# Patient Record
Sex: Female | Born: 1935 | Race: White | Hispanic: No | Marital: Married | Smoking: Never smoker
Health system: Southern US, Community
[De-identification: ages and names within clinical notes are randomized; demographics above are authoritative.]

## PROBLEM LIST (undated history)

## (undated) DIAGNOSIS — I10 Essential (primary) hypertension: Secondary | ICD-10-CM

## (undated) DIAGNOSIS — E079 Disorder of thyroid, unspecified: Secondary | ICD-10-CM

## (undated) DIAGNOSIS — M109 Gout, unspecified: Secondary | ICD-10-CM

## (undated) DIAGNOSIS — M316 Other giant cell arteritis: Secondary | ICD-10-CM

## (undated) HISTORY — PX: BACK SURGERY: SHX140

## (undated) HISTORY — PX: TOTAL SHOULDER REPLACEMENT: SUR1217

## (undated) HISTORY — PX: ABDOMINAL HYSTERECTOMY: SHX81

## (undated) HISTORY — PX: CHOLECYSTECTOMY: SHX55

## (undated) HISTORY — PX: TONSILLECTOMY: SUR1361

## (undated) HISTORY — PX: JOINT REPLACEMENT: SHX530

## (undated) HISTORY — PX: APPENDECTOMY: SHX54

## (undated) HISTORY — PX: TOTAL HIP ARTHROPLASTY: SHX124

---

## 2015-07-09 HISTORY — PX: BACK SURGERY: SHX140

## 2016-09-05 DIAGNOSIS — M109 Gout, unspecified: Secondary | ICD-10-CM

## 2016-09-05 HISTORY — DX: Gout, unspecified: M10.9

## 2017-03-08 ENCOUNTER — Encounter (HOSPITAL_BASED_OUTPATIENT_CLINIC_OR_DEPARTMENT_OTHER): Payer: Self-pay

## 2017-03-08 ENCOUNTER — Emergency Department (HOSPITAL_BASED_OUTPATIENT_CLINIC_OR_DEPARTMENT_OTHER)
Admission: EM | Admit: 2017-03-08 | Discharge: 2017-03-08 | Disposition: A | Discharge status: Home / Self Care | Payer: Medicare HMO | Attending: Emergency Medicine | Admitting: Emergency Medicine

## 2017-03-08 ENCOUNTER — Emergency Department (HOSPITAL_BASED_OUTPATIENT_CLINIC_OR_DEPARTMENT_OTHER): Payer: Medicare HMO

## 2017-03-08 DIAGNOSIS — Z79899 Other long term (current) drug therapy: Secondary | ICD-10-CM | POA: Diagnosis not present

## 2017-03-08 DIAGNOSIS — M79671 Pain in right foot: Secondary | ICD-10-CM | POA: Diagnosis present

## 2017-03-08 DIAGNOSIS — L03115 Cellulitis of right lower limb: Secondary | ICD-10-CM | POA: Insufficient documentation

## 2017-03-08 DIAGNOSIS — I10 Essential (primary) hypertension: Secondary | ICD-10-CM | POA: Insufficient documentation

## 2017-03-08 DIAGNOSIS — L03818 Cellulitis of other sites: Secondary | ICD-10-CM

## 2017-03-08 DIAGNOSIS — R52 Pain, unspecified: Secondary | ICD-10-CM

## 2017-03-08 HISTORY — DX: Disorder of thyroid, unspecified: E07.9

## 2017-03-08 HISTORY — DX: Essential (primary) hypertension: I10

## 2017-03-08 LAB — CBC WITH DIFFERENTIAL/PLATELET
Basophils Absolute: 0 10*3/uL (ref 0.0–0.1)
Basophils Relative: 0 %
EOS ABS: 0.2 10*3/uL (ref 0.0–0.7)
EOS PCT: 1 %
HEMATOCRIT: 36 % (ref 36.0–46.0)
Hemoglobin: 11.9 g/dL — ABNORMAL LOW (ref 12.0–15.0)
LYMPHS ABS: 1 10*3/uL (ref 0.7–4.0)
Lymphocytes Relative: 6 %
MCH: 28.5 pg (ref 26.0–34.0)
MCHC: 33.1 g/dL (ref 30.0–36.0)
MCV: 86.3 fL (ref 78.0–100.0)
MONO ABS: 1.5 10*3/uL — AB (ref 0.1–1.0)
MONOS PCT: 9 %
NEUTROS PCT: 85 %
Neutro Abs: 14.5 10*3/uL — ABNORMAL HIGH (ref 1.7–7.7)
Platelets: 254 10*3/uL (ref 150–400)
RBC: 4.17 MIL/uL (ref 3.87–5.11)
RDW: 13.6 % (ref 11.5–15.5)
WBC: 17.1 10*3/uL — ABNORMAL HIGH (ref 4.0–10.5)

## 2017-03-08 LAB — BASIC METABOLIC PANEL
Anion gap: 9 (ref 5–15)
BUN: 17 mg/dL (ref 6–20)
CALCIUM: 9.1 mg/dL (ref 8.9–10.3)
CO2: 26 mmol/L (ref 22–32)
Chloride: 99 mmol/L — ABNORMAL LOW (ref 101–111)
Creatinine, Ser: 0.89 mg/dL (ref 0.44–1.00)
GFR, EST NON AFRICAN AMERICAN: 60 mL/min — AB (ref >60)
GLUCOSE: 120 mg/dL — AB (ref 65–99)
POTASSIUM: 3.8 mmol/L (ref 3.5–5.1)
Sodium: 134 mmol/L — ABNORMAL LOW (ref 135–145)

## 2017-03-08 MED ORDER — HYDROCODONE-ACETAMINOPHEN 5-325 MG PO TABS: 1.0000 | ORAL_TABLET | Freq: Four times a day (QID) | ORAL | 0 refills | Status: DC | PRN

## 2017-03-08 MED ORDER — CEPHALEXIN 500 MG PO CAPS: 500.0000 mg | ORAL_CAPSULE | Freq: Four times a day (QID) | ORAL | 0 refills | Status: DC

## 2017-03-08 MED ORDER — HYDROCODONE-ACETAMINOPHEN 5-325 MG PO TABS
1.0000 | ORAL_TABLET | Freq: Once | ORAL | Status: AC
  Administered 2017-03-08: 1 via ORAL
  Filled 2017-03-08: qty 1

## 2017-03-08 NOTE — ED Notes (Signed)
MD at bedside discussing results with patient and family. 

## 2017-03-08 NOTE — ED Provider Notes (Signed)
[facility] Note   CSN: [id number] Arrival date & time: 03/08/17  1013     History   Chief Complaint Chief Complaint  Patient presents with  . Foot Pain    HPI Regina Holden is a 82 y.o. female.  HPI  82 year old female who presents with right foot pain 3 days. She has a history of temporal arteritis, on chronic steroids. Also history of hypertension and thyroid disease. States about 3 days ago developed a cramping pain in her right foot. Over the past few days has developed swelling to the dorsum of her right foot. She has been unable to bear weight on that foot due to pain. No fevers, chills, night sweats, nausea or vomiting. No injury or fall. No history of rheumatoid arthritis or gout. Tramadol, without relief in her pain. Pain worse with ambulation and movement. Improved with rest.  Past Medical History:  Diagnosis Date  . Hypertension   . Thyroid disease     There are no active problems to display for this patient.   History reviewed. No pertinent surgical history.  OB History    No data available       Home Medications    Prior to Admission medications   Medication Sig Start Date End Date Taking? Authorizing Provider  bisoprolol-hydrochlorothiazide (ZIAC) 10-6.25 MG tablet Take 1 tablet by mouth daily.   Yes [provider]  diclofenac sodium (VOLTAREN) 1 % GEL Apply topically 4 (four) times daily.   Yes [provider]  levothyroxine (SYNTHROID, LEVOTHROID) 88 MCG tablet Take 88 mcg by mouth daily before breakfast.   Yes [provider]  traMADol (ULTRAM) 50 MG tablet Take by mouth every 6 (six) hours as needed.   Yes [provider]  cephALEXin (KEFLEX) 500 MG capsule Take 1 capsule (500 mg total) by mouth 4 (four) times daily. 03/08/17 03/15/17  Lavera GuiseLiu, Rajohn Henery Duo, MD  HYDROcodone-acetaminophen (NORCO/VICODIN) 5-325 MG tablet Take 1 tablet by mouth every 6 (six) hours as needed for moderate pain. 03/08/17    Lavera GuiseLiu, Arkel Cartwright Duo, MD    Family History No family history on file.  Social History Social History  Substance Use Topics  . Smoking status: Never Smoker  . Smokeless tobacco: Never Used  . Alcohol use No     Allergies   Amlodipine; Codeine; and Macrobid [nitrofurantoin macrocrystal]   Review of Systems Review of Systems  Constitutional: Negative for fever.  Respiratory: Negative for shortness of breath.   Cardiovascular: Negative for chest pain.  Gastrointestinal: Negative for nausea and vomiting.  Musculoskeletal:       Right foot pain  Skin: Negative for wound.  All other systems reviewed and are negative.    Physical Exam Updated Vital Signs BP (!) 158/52 (BP Location: Right Arm)   Pulse 62   Temp 98.3 F (36.8 C) (Oral)   Resp 20   Ht 5\' 3"  (1.6 m)   Wt 75.8 kg (167 lb)   SpO2 98%   BMI 29.58 kg/m   Physical Exam Physical Exam  Nursing note and vitals reviewed. Constitutional: Well developed, well nourished, non-toxic, and in no acute distress Head: Normocephalic and atraumatic.  Mouth/Throat: Oropharynx is clear and moist.  Neck: Normal range of motion. Neck supple.  Cardiovascular: Normal rate and regular rhythm.   +2 DP pulses bilaterally Pulmonary/Chest: Effort normal and breath sounds normal.  Abdominal: Soft. There is no tenderness. There is no rebound and no guarding.  Musculoskeletal: No deformities, but limited range of  motion of the right foot due to pain.  Neurological: Alert, no facial droop, fluent speech, sensation to light touch intact in bilateral lower extremities, full strength and large toe dorsi plantar flexion bilaterally Skin: Skin is warm and dry.  there is warmth, mild erythema, and tenderness to palpation over the dorsum of the right foot. Psychiatric: Cooperative   ED Treatments / Results  Labs (all labs ordered are listed, but only abnormal results are displayed) Labs Reviewed  CBC WITH DIFFERENTIAL/PLATELET - Abnormal;  Notable for the following:       Result Value   WBC 17.1 (*)    Hemoglobin 11.9 (*)    Neutro Abs 14.5 (*)    Monocytes Absolute 1.5 (*)    All other components within normal limits  BASIC METABOLIC PANEL - Abnormal; Notable for the following:    Sodium 134 (*)    Chloride 99 (*)    Glucose, Bld 120 (*)    GFR calc non Af Amer 60 (*)    All other components within normal limits    EKG  EKG Interpretation None       Radiology Dg Ankle Complete Right  Result Date: 03/08/2017 CLINICAL DATA:  Right foot and ankle pain. Swelling over the dorsum of the foot. Initial encounter. EXAM: RIGHT ANKLE - COMPLETE 3+ VIEW COMPARISON:  Right foot radiographs of the same day. FINDINGS: Soft tissue swelling is present of the dorsum of the hindfoot and anterior to the ankle. There is no acute osseous abnormality. Mild osteopenia is noted. Vascular calcifications are present. Calcaneal spurs are noted. IMPRESSION: 1. Soft tissue swelling over the dorsum of the hindfoot and anterior to the ankle without underlying fracture. This may reflect soft tissue injury, edema, or cellulitis. 2. No acute or focal osseous abnormality. 3. Microvascular calcifications suggesting underlying diabetes. Electronically Signed   By: Marin Roberts M.D.   On: 03/08/2017 11:53   Dg Foot Complete Right  Result Date: 03/08/2017 CLINICAL DATA:  pain that radiates up to her ankle 3 days. Pt states about 3 days ago developed a cramping pain in her right foot. Over the past few days has developed swelling to the dorsum of her right foot. She has been unable to bear weight on that foot due to pain. Pt denies any injury EXAM: RIGHT FOOT COMPLETE - 3+ VIEW COMPARISON:  None. FINDINGS: There is no evidence of fracture or dislocation. Mild osteopenia. Calcaneal spurs. There is no evidence of arthropathy or other focal bone abnormality. Calcifications in the distal posterior tibial artery. Soft tissues are unremarkable. IMPRESSION: 1.  Negative for fracture or other acute bone finding. 2. Osteopenia and calcaneal spurs Electronically Signed   By: Corlis Leak M.D.   On: 03/08/2017 11:53    Procedures Procedures (including critical care time)  Medications Ordered in ED Medications  HYDROcodone-acetaminophen (NORCO/VICODIN) 5-325 MG per tablet 1 tablet (1 tablet Oral Given 03/08/17 1112)     Initial Impression / Assessment and Plan / ED Course  I have reviewed the triage vital signs and the nursing notes.  Pertinent labs & imaging results that were available during my care of the patient were reviewed by me and considered in my medical decision making (see chart for details).     Presenting with right foot pain and swelling. Is well-appearing in no acute distress. Afebrile and hemodynamically stable. There is slight erythema and warmth with soft tissue swelling over the dorsum of the right foot that extends to the ankle. Presentation is  concerning for cellulitis. X-ray of the foot shows no evidence of fracture or severe arthritis, but there is some soft tissue swelling. She does have a leukocytosis of 17, but also is taking chronic steroids which may also be contributing to this. I'm she has no systemic signs or symptoms of illness. We'll treat with a course of Keflex. Strict return and follow-up instructions reviewed. She expressed understanding of all discharge instructions and felt comfortable with the plan of care.   Final Clinical Impressions(s) / ED Diagnoses   Final diagnoses:  Right foot pain  Cellulitis of other specified site    New Prescriptions New Prescriptions   CEPHALEXIN (KEFLEX) 500 MG CAPSULE    Take 1 capsule (500 mg total) by mouth 4 (four) times daily.   HYDROCODONE-ACETAMINOPHEN (NORCO/VICODIN) 5-325 MG TABLET    Take 1 tablet by mouth every 6 (six) hours as needed for moderate pain.     Lavera Guise, MD 03/08/17 1224

## 2017-03-08 NOTE — Discharge Instructions (Signed)
You likely have a skin infection of the right foot Take antibiotics as prescribed Do not take tramadol while you are taking NOrco/Vicoden Return without fail for worsening symptoms, including worsening pain/swelling, fever, confusion, or any other symptoms concerning ot you.

## 2017-03-08 NOTE — ED Triage Notes (Signed)
Pt reports cramp feeling to right foot on Thursday night. Reports increased swelling and pain. Sts unable to bear weight on right foot. Pain from toes to ankle.

## 2017-03-11 ENCOUNTER — Emergency Department (HOSPITAL_COMMUNITY): Payer: Medicare HMO

## 2017-03-11 ENCOUNTER — Observation Stay (HOSPITAL_COMMUNITY)
Admission: EM | Admit: 2017-03-11 | Discharge: 2017-03-13 | Disposition: A | Discharge status: Home / Self Care | Payer: Medicare HMO | Attending: Internal Medicine | Admitting: Internal Medicine

## 2017-03-11 ENCOUNTER — Encounter (HOSPITAL_COMMUNITY): Payer: Self-pay | Admitting: *Deleted

## 2017-03-11 DIAGNOSIS — Z7952 Long term (current) use of systemic steroids: Secondary | ICD-10-CM

## 2017-03-11 DIAGNOSIS — L03115 Cellulitis of right lower limb: Secondary | ICD-10-CM

## 2017-03-11 DIAGNOSIS — R2241 Localized swelling, mass and lump, right lower limb: Secondary | ICD-10-CM | POA: Diagnosis present

## 2017-03-11 DIAGNOSIS — M79609 Pain in unspecified limb: Secondary | ICD-10-CM

## 2017-03-11 DIAGNOSIS — E039 Hypothyroidism, unspecified: Secondary | ICD-10-CM | POA: Insufficient documentation

## 2017-03-11 DIAGNOSIS — Z79899 Other long term (current) drug therapy: Secondary | ICD-10-CM | POA: Diagnosis not present

## 2017-03-11 DIAGNOSIS — M19079 Primary osteoarthritis, unspecified ankle and foot: Secondary | ICD-10-CM | POA: Diagnosis not present

## 2017-03-11 DIAGNOSIS — R5381 Other malaise: Secondary | ICD-10-CM | POA: Insufficient documentation

## 2017-03-11 DIAGNOSIS — M79672 Pain in left foot: Secondary | ICD-10-CM | POA: Diagnosis not present

## 2017-03-11 DIAGNOSIS — M109 Gout, unspecified: Secondary | ICD-10-CM | POA: Diagnosis present

## 2017-03-11 DIAGNOSIS — Z96611 Presence of right artificial shoulder joint: Secondary | ICD-10-CM | POA: Diagnosis not present

## 2017-03-11 DIAGNOSIS — M1000 Idiopathic gout, unspecified site: Secondary | ICD-10-CM | POA: Diagnosis present

## 2017-03-11 DIAGNOSIS — M10072 Idiopathic gout, left ankle and foot: Secondary | ICD-10-CM | POA: Insufficient documentation

## 2017-03-11 DIAGNOSIS — Z96641 Presence of right artificial hip joint: Secondary | ICD-10-CM | POA: Diagnosis not present

## 2017-03-11 DIAGNOSIS — M79671 Pain in right foot: Secondary | ICD-10-CM | POA: Diagnosis not present

## 2017-03-11 DIAGNOSIS — I10 Essential (primary) hypertension: Secondary | ICD-10-CM | POA: Diagnosis not present

## 2017-03-11 DIAGNOSIS — M10071 Idiopathic gout, right ankle and foot: Principal | ICD-10-CM | POA: Insufficient documentation

## 2017-03-11 DIAGNOSIS — M316 Other giant cell arteritis: Secondary | ICD-10-CM | POA: Diagnosis present

## 2017-03-11 HISTORY — DX: Other giant cell arteritis: M31.6

## 2017-03-11 HISTORY — DX: Gout, unspecified: M10.9

## 2017-03-11 LAB — COMPREHENSIVE METABOLIC PANEL
ALK PHOS: 76 U/L (ref 38–126)
ALT: 15 U/L (ref 14–54)
ANION GAP: 12 (ref 5–15)
AST: 21 U/L (ref 15–41)
Albumin: 3.6 g/dL (ref 3.5–5.0)
BILIRUBIN TOTAL: 0.4 mg/dL (ref 0.3–1.2)
BUN: 26 mg/dL — ABNORMAL HIGH (ref 6–20)
CALCIUM: 9.1 mg/dL (ref 8.9–10.3)
CO2: 22 mmol/L (ref 22–32)
CREATININE: 0.88 mg/dL (ref 0.44–1.00)
Chloride: 99 mmol/L — ABNORMAL LOW (ref 101–111)
Glucose, Bld: 118 mg/dL — ABNORMAL HIGH (ref 65–99)
Potassium: 4.8 mmol/L (ref 3.5–5.1)
SODIUM: 133 mmol/L — AB (ref 135–145)
TOTAL PROTEIN: 7.7 g/dL (ref 6.5–8.1)

## 2017-03-11 LAB — CBC WITH DIFFERENTIAL/PLATELET
Basophils Absolute: 0 10*3/uL (ref 0.0–0.1)
Basophils Relative: 0 %
EOS ABS: 0 10*3/uL (ref 0.0–0.7)
Eosinophils Relative: 0 %
HEMATOCRIT: 36.4 % (ref 36.0–46.0)
HEMOGLOBIN: 12.2 g/dL (ref 12.0–15.0)
LYMPHS ABS: 1 10*3/uL (ref 0.7–4.0)
Lymphocytes Relative: 6 %
MCH: 28.9 pg (ref 26.0–34.0)
MCHC: 33.5 g/dL (ref 30.0–36.0)
MCV: 86.3 fL (ref 78.0–100.0)
MONOS PCT: 5 %
Monocytes Absolute: 1 10*3/uL (ref 0.1–1.0)
NEUTROS PCT: 89 %
Neutro Abs: 16.2 10*3/uL — ABNORMAL HIGH (ref 1.7–7.7)
Platelets: 342 10*3/uL (ref 150–400)
RBC: 4.22 MIL/uL (ref 3.87–5.11)
RDW: 13.4 % (ref 11.5–15.5)
WBC: 18.2 10*3/uL — ABNORMAL HIGH (ref 4.0–10.5)

## 2017-03-11 MED ORDER — ONDANSETRON HCL 4 MG/2ML IJ SOLN: 4.0000 mg | Freq: Once | INTRAMUSCULAR | Status: DC

## 2017-03-11 MED ORDER — ONDANSETRON HCL 4 MG/2ML IJ SOLN: 4.0000 mg | Freq: Four times a day (QID) | INTRAMUSCULAR | Status: DC | PRN

## 2017-03-11 MED ORDER — VANCOMYCIN HCL 10 G IV SOLR
1500.0000 mg | Freq: Once | INTRAVENOUS | Status: DC
  Filled 2017-03-11 (×2): qty 1500

## 2017-03-11 MED ORDER — OXYCODONE-ACETAMINOPHEN 7.5-325 MG PO TABS
1.0000 | ORAL_TABLET | ORAL | Status: DC | PRN
  Administered 2017-03-12 (×3): 2 via ORAL
  Administered 2017-03-12: 1 via ORAL
  Administered 2017-03-13: 2 via ORAL
  Filled 2017-03-11 (×2): qty 1
  Filled 2017-03-11 (×4): qty 2

## 2017-03-11 MED ORDER — ONDANSETRON HCL 4 MG PO TABS: 4.0000 mg | ORAL_TABLET | Freq: Four times a day (QID) | ORAL | Status: DC | PRN

## 2017-03-11 MED ORDER — VANCOMYCIN HCL 10 G IV SOLR: 1250.0000 mg | INTRAVENOUS | Status: DC

## 2017-03-11 MED ORDER — VANCOMYCIN HCL IN DEXTROSE 1-5 GM/200ML-% IV SOLN
1000.0000 mg | Freq: Once | INTRAVENOUS | Status: DC
  Filled 2017-03-11: qty 200

## 2017-03-11 NOTE — ED Notes (Signed)
Report to Allison, RN 300 

## 2017-03-11 NOTE — H&P (Addendum)
[facility] History and Physical  [name] GNF:[id number] DOB: 09-May-1935 DOA: 03/11/2017  Referring physician: Dr Jacqulyn Bath, ED MD PCP: System, Pcp Not In   Chief Complaint: R foot pain and redness  HPI: Regina Holden is a 82 y.o. female with hx of hypoT4, hx of temporal arteritis , chron pred dependency presented to ED with pain and swelling/ redness of R foot.  She developed pain 5-6 days ago , started with a "cramp" in the R foot. She went to [facility] on 9/1 for the same and was given Keflex.  Xrays of R foot and ankle were negative. Not getting better, in spite of increasing prednisone to 10 mg then to 15 mg per day.  Came to ED here where WBC 18k (was 17k 3 days ago).  No fever.  Asked to see for admission.      Pt developed acute onset visual problems in 2004.  Was seen by PCP and sent to opthalmologist and dx'd with temporal arteritis.  "My sed rate was sky high".  She has been on prednisone ever since, taking 5 mg / day at baseline.  She lives w/ her husband in [place], they are visiting family up in [place] and [place].  They split their time between [place] and [place].    Hx of cholecystectomy, R shoulder replacement, R hip replacement, back surgery 2017, hysterectomy, tonsillect/ appendect as a child.   ROS  denies CP  no HA  no blurry vision  no rash  no diarrhea  no nausea/ vomiting  no dysuria  no difficulty voiding  no change in urine color    Past Medical History  Past Medical History:  Diagnosis Date  . Hypertension   . Temporal arteritis (HCC)   . Thyroid disease    Past Surgical History  Past Surgical History:  Procedure Laterality Date  . ABDOMINAL HYSTERECTOMY    . APPENDECTOMY    . BACK SURGERY    . CHOLECYSTECTOMY    . TONSILLECTOMY    . TOTAL HIP ARTHROPLASTY Right   . TOTAL SHOULDER REPLACEMENT Right    Family History No family history on file. Social History  reports that she has never smoked. She has never used smokeless tobacco. She reports  that she does not drink alcohol or use drugs. Allergies  Allergies  Allergen Reactions  . Codeine   . Macrobid [Nitrofurantoin Macrocrystal]   . Amlodipine Swelling and Palpitations  . Penicillins Swelling and Rash    Has patient had a PCN reaction causing immediate rash, facial/tongue/throat swelling, SOB or lightheadedness with hypotension: Yes Has patient had a PCN reaction causing severe rash involving mucus membranes or skin necrosis: No Has patient had a PCN reaction that required hospitalization: No Has patient had a PCN reaction occurring within the last 10 years: Non If all of the above answers are "NO", then may proceed with Cephalosporin use.    Home medications Prior to Admission medications   Medication Sig Start Date End Date Taking? Authorizing Provider  cephALEXin (KEFLEX) 500 MG capsule Take 1 capsule (500 mg total) by mouth 4 (four) times daily. 03/08/17 03/15/17  Lavera Guise, MD  diclofenac sodium (VOLTAREN) 1 % GEL Apply topically 4 (four) times daily.    [provider]  HYDROcodone-acetaminophen (NORCO/VICODIN) 5-325 MG tablet Take 1 tablet by mouth every 6 (six) hours as needed for moderate pain. 03/08/17   Lavera Guise, MD  levothyroxine (SYNTHROID, LEVOTHROID) 88 MCG tablet Take 88 mcg by mouth daily  before breakfast.    [provider]  traMADol (ULTRAM) 50 MG tablet Take by mouth every 6 (six) hours as needed.    [provider]   Liver Function Tests  Recent Labs Lab 03/11/17 1946  AST 21  ALT 15  ALKPHOS 76  BILITOT 0.4  PROT 7.7  ALBUMIN 3.6   No results for input(s): LIPASE, AMYLASE in the last 168 hours. CBC  Recent Labs Lab 03/08/17 1108 03/11/17 1946  WBC 17.1* 18.2*  NEUTROABS 14.5* 16.2*  HGB 11.9* 12.2  HCT 36.0 36.4  MCV 86.3 86.3  PLT 254 342   Basic Metabolic Panel  Recent Labs Lab 03/08/17 1108 03/11/17 1946  NA 134* 133*  K 3.8 4.8  CL 99* 99*  CO2 26 22  GLUCOSE 120* 118*  BUN 17 26*   CREATININE 0.89 0.88  CALCIUM 9.1 9.1     Vitals:   03/11/17 1816 03/11/17 1817 03/11/17 1920 03/11/17 2110  BP: (S) (!) 208/59  (!) 168/69 (!) 188/67  Pulse: 61   62  Resp: 18   14  Temp: 98.3 F (36.8 C)     TempSrc: Oral     SpO2: 100%   97%  Weight:  75.8 kg (167 lb)    Height:  5\' 3"  (1.6 m)     Exam: Gen thinning of skin on face No rash, cyanosis or gangrene Sclera anicteric, throat clear  No jvd or bruits Chest clear bilat RRR no MRG Abd soft ntnd no mass or ascites +bs healed scars midline GU defer Ext  - R midfoot swollen, red and very tender to touch or movement; 1st MTP red and painful/ tender, great toe red/ tender - L foot 1st MTP red and tender, great toe red and tender - no ankle pain or swelling Neuro is alert, Ox 3 , nf    Home meds: pred 5 qd/ norco prn/ T4/  Ultram/ keflex 500 qid   Na 133  K 4.8  CO2 22  BUN 26  Cr 0.88   WBC 18k   Hb 12  plt 342     Assessment: 1.  Bilat foot pain/ joint inflammation/ erythema - most c/w polyarticular gout.  [place]  Not on any maintenance Rx.  Doubt cellulitis with bilat foot involvement, characteristic joints and marked pain/ tenderness.  Plan check uric acid, start IV solumedrol 20 bid. DC abx.  No abx. PT eval when pain better.  2.  Debility - due to bilat gout flare in feet, unable to walk 3.  Hx temporal arteritis - steroid dependent 4.  Hypothyroid 5.  HTN - cont Ziac (bisoprolol/ HCT)  Plan - as above      [name] [facility] Pager [phone]   If 7PM-7AM, please contact night-coverage [email or web address] Password [email or web address] 03/11/2017, 9:53 PM

## 2017-03-11 NOTE — ED Notes (Signed)
Did not give Vancomycin due to admitting doctor discontinuing medication

## 2017-03-11 NOTE — ED Provider Notes (Signed)
Emergency Department Provider Note   I have reviewed the triage vital signs and the nursing notes.   HISTORY  Chief Complaint Foot Pain   HPI Regina Holden is a 82 y.o. female with PMH of temporal arteritis on chronic steroids, HTN, and thyroid disease resents to the emergency department for evaluation of worsening pain, redness, swelling of the right foot. Symptoms have been ongoing for the past 5 days. Patient was seen in the emergency department 3 days ago started on Keflex and given pain medication for presumed cellulitis. She's been taking his medications but feels her symptoms are worsening. Today she reports subjective fever and shaking chills at times. She notes she has taken all of her pain medication and cannot bear weight on the foot because of severe pain. The redness and warmth seems to be worsening over the entire foot. No new injury or obvious inciting event. The patient increased her dose of steroid at home from 5 mg daily to 10 mg daily with no relief in symptoms.    Past Medical History:  Diagnosis Date  . Gout 09/2016   L foot/ MTP  . Hypertension   . Temporal arteritis (HCC)   . Thyroid disease     Patient Active Problem List   Diagnosis Date Noted  . Foot pain, bilateral 03/11/2017  . Inflammation of foot joint 03/11/2017  . Gout of both feet 03/11/2017  . Temporal arteritis (HCC) 03/11/2017  . Polyarticular gout 03/11/2017    Past Surgical History:  Procedure Laterality Date  . ABDOMINAL HYSTERECTOMY    . APPENDECTOMY    . BACK SURGERY    . BACK SURGERY  2017   Lumbar  . CHOLECYSTECTOMY    . JOINT REPLACEMENT Right    Shoulder  . JOINT REPLACEMENT Right    Hip  . TONSILLECTOMY    . TOTAL HIP ARTHROPLASTY Right   . TOTAL SHOULDER REPLACEMENT Right       Allergies Codeine; Macrobid [nitrofurantoin macrocrystal]; Amlodipine; and Penicillins  History reviewed. No pertinent family history.  Social History Social History  Substance  Use Topics  . Smoking status: Never Smoker  . Smokeless tobacco: Never Used  . Alcohol use No    Review of Systems  Constitutional: No fever/chills Eyes: No visual changes. ENT: No sore throat. Cardiovascular: Denies chest pain. Respiratory: Denies shortness of breath. Gastrointestinal: No abdominal pain.  No nausea, no vomiting.  No diarrhea.  No constipation. Genitourinary: Negative for dysuria. Musculoskeletal: Negative for back pain. Skin: Negative for rash. Neurological: Negative for headaches, focal weakness or numbness.  10-point ROS otherwise negative.  ____________________________________________   PHYSICAL EXAM:  VITAL SIGNS: ED Triage Vitals  Enc Vitals Group     BP 03/11/17 1816 (S) (!) 208/59     Pulse Rate 03/11/17 1816 61     Resp 03/11/17 1816 18     Temp 03/11/17 1816 98.3 F (36.8 C)     Temp Source 03/11/17 1816 Oral     SpO2 03/11/17 1816 100 %     Weight 03/11/17 1817 167 lb (75.8 kg)     Height 03/11/17 1817 5\' 3"  (1.6 m)     Pain Score 03/11/17 1816 10   Constitutional: Alert and oriented. Well appearing and in no acute distress. Eyes: Conjunctivae are normal. Head: Atraumatic. Nose: No congestion/rhinnorhea. Mouth/Throat: Mucous membranes are moist.   Neck: No stridor.   Cardiovascular: Normal rate, regular rhythm. Good peripheral circulation. Grossly normal heart sounds.   Respiratory: Normal respiratory effort.  No retractions. Lungs CTAB. Gastrointestinal: Soft and nontender. No distention.  Musculoskeletal: Diffuse right foot edema and erythema. No focal joint redness or swelling.  Neurologic:  Normal speech and language. No gross focal neurologic deficits are appreciated.  Skin:  Skin is warm, dry and intact. No rash noted.  ____________________________________________   LABS (all labs ordered are listed, but only abnormal results are displayed)  Labs Reviewed  CBC WITH DIFFERENTIAL/PLATELET - Abnormal; Notable for the following:        Result Value   WBC 18.2 (*)    Neutro Abs 16.2 (*)    All other components within normal limits  COMPREHENSIVE METABOLIC PANEL - Abnormal; Notable for the following:    Sodium 133 (*)    Chloride 99 (*)    Glucose, Bld 118 (*)    BUN 26 (*)    All other components within normal limits  URIC ACID - Abnormal; Notable for the following:    Uric Acid, Serum 8.2 (*)    All other components within normal limits  CBC - Abnormal; Notable for the following:    WBC 13.4 (*)    RBC 3.70 (*)    Hemoglobin 10.5 (*)    HCT 32.1 (*)    All other components within normal limits  CULTURE, BLOOD (ROUTINE X 2)  CULTURE, BLOOD (ROUTINE X 2)  URINALYSIS, ROUTINE W REFLEX MICROSCOPIC   ____________________________________________  RADIOLOGY  Dg Foot 2 Views Right  Result Date: 03/11/2017 CLINICAL DATA:  Right foot pain and swelling for 5 days. Cellulitis. EXAM: RIGHT FOOT - 2 VIEW COMPARISON:  Right foot radiographs 3 days prior 03/08/2017 FINDINGS: There is no evidence of fracture or dislocation. No bony destructive change. Soft tissues are unremarkable. Degenerative subchondral cystic change in the medial cuneiform. Mild osteoarthritis of the first metatarsal phalangeal joint. Plantar calcaneal spur and Achilles tendon enthesophyte. Increased soft tissue edema from prior exam. No soft tissue air. No radiopaque foreign body. IMPRESSION: Increased soft tissue edema consistent with stated history of cellulitis. No acute osseous abnormality or radiographic findings of osteomyelitis. No soft tissue air. Electronically Signed   By: Rubye Oaks M.D.   On: 03/11/2017 22:30    ____________________________________________   PROCEDURES  Procedure(s) performed:   Procedures  None ____________________________________________   INITIAL IMPRESSION / ASSESSMENT AND PLAN / ED COURSE  Pertinent labs & imaging results that were available during my care of the patient were reviewed by me and  considered in my medical decision making (see chart for details).  Patient resents emergency department for evaluation of right foot swelling and redness with increased warmth and subjective fever/chills today. Patient has tenderness over the entire foot. Low suspicion for septic joint with most of the foot being swollen and red. Leukocytosis worsening since last ED visit. Plan for IV abx and admission.   Discussed patient's case with Hospitalist, Dr. Arlean Hopping to request admission. Patient and family (if present) updated with plan. Care transferred to Hospitalist service.  I reviewed all nursing notes, vitals, pertinent old records, EKGs, labs, imaging (as available).  ____________________________________________  FINAL CLINICAL IMPRESSION(S) / ED DIAGNOSES  Final diagnoses:  Cellulitis of right foot  Right foot pain     MEDICATIONS GIVEN DURING THIS VISIT:  Medications  bisoprolol-hydrochlorothiazide (ZIAC) 10-6.25 MG per tablet 1 tablet (not administered)  levothyroxine (SYNTHROID, LEVOTHROID) tablet 100 mcg (not administered)  simvastatin (ZOCOR) tablet 20 mg (not administered)  enoxaparin (LOVENOX) injection 40 mg (not administered)  0.9 %  sodium chloride infusion (  Intravenous New Bag/Given 03/12/17 0055)  acetaminophen (TYLENOL) tablet 650 mg (not administered)    Or  acetaminophen (TYLENOL) suppository 650 mg (not administered)  ondansetron (ZOFRAN) tablet 4 mg (not administered)    Or  ondansetron (ZOFRAN) injection 4 mg (not administered)  methylPREDNISolone sodium succinate (SOLU-MEDROL) 40 mg/mL injection 20 mg (20 mg Intravenous Given 03/12/17 0053)  oxyCODONE-acetaminophen (PERCOCET) 7.5-325 MG per tablet 1-2 tablet (1 tablet Oral Given 03/12/17 0053)  morphine 2 MG/ML injection 2-4 mg (2 mg Intravenous Given 03/12/17 0529)  levothyroxine (SYNTHROID, LEVOTHROID) tablet 88 mcg (88 mcg Oral Given 03/12/17 0831)     NEW OUTPATIENT MEDICATIONS STARTED DURING THIS  VISIT:  None   Note:  This document was prepared using Dragon voice recognition software and may include unintentional dictation errors.  Alona BeneJoshua Donna Snooks, MD Emergency Medicine    Hollyann Pablo, Arlyss RepressJoshua G, MD 03/12/17 [phone]

## 2017-03-11 NOTE — ED Notes (Signed)
Called [facility] to get Vancomycin

## 2017-03-11 NOTE — Progress Notes (Signed)
Pharmacy Antibiotic Note  Regina CoverMartha Holden is a 82 y.o. female admitted on 03/11/2017 with cellulitis.  Pharmacy has been consulted for vancomycin dosing.  Plan: Vancomycin 1500 mg IV x 1 then 1250 mg IV q24 hours F/u renal function, cultures and clinical course  Height: 5\' 3"  (160 cm) Weight: 167 lb (75.8 kg) IBW/kg (Calculated) : 52.4  Temp (24hrs), Avg:98.3 F (36.8 C), Min:98.3 F (36.8 C), Max:98.3 F (36.8 C)   Recent Labs Lab 03/08/17 1108 03/11/17 1946  WBC 17.1* 18.2*  CREATININE 0.89 0.88    Estimated Creatinine Clearance: 49.7 mL/min (by C-G formula based on SCr of 0.88 mg/dL).    Allergies  Allergen Reactions  . Amlodipine   . Codeine   . Macrobid [Nitrofurantoin Macrocrystal]   . Penicillins Rash    Thank you for allowing pharmacy to be a part of this patient's care.  Talbert CageSeay, Delphia Kaylor Poteet 03/11/2017 9:02 PM

## 2017-03-11 NOTE — ED Triage Notes (Signed)
Pt was seen at [facility]  On 9/1 for this problem. She has been having right foot swelling, redness, and warmth since Thursday. She was placed on keflex and has taken as prescribed with worsening of her pain and symptoms. She is now having redness, warmth, and swelling to left foot big toe as well.

## 2017-03-12 ENCOUNTER — Encounter (HOSPITAL_COMMUNITY): Payer: Self-pay

## 2017-03-12 DIAGNOSIS — M109 Gout, unspecified: Secondary | ICD-10-CM | POA: Diagnosis not present

## 2017-03-12 DIAGNOSIS — I10 Essential (primary) hypertension: Secondary | ICD-10-CM | POA: Diagnosis not present

## 2017-03-12 DIAGNOSIS — M79671 Pain in right foot: Secondary | ICD-10-CM | POA: Diagnosis not present

## 2017-03-12 DIAGNOSIS — M79672 Pain in left foot: Secondary | ICD-10-CM

## 2017-03-12 DIAGNOSIS — E785 Hyperlipidemia, unspecified: Secondary | ICD-10-CM

## 2017-03-12 DIAGNOSIS — M1000 Idiopathic gout, unspecified site: Secondary | ICD-10-CM | POA: Diagnosis not present

## 2017-03-12 LAB — CBC
HCT: 32.1 % — ABNORMAL LOW (ref 36.0–46.0)
HEMOGLOBIN: 10.5 g/dL — AB (ref 12.0–15.0)
MCH: 28.4 pg (ref 26.0–34.0)
MCHC: 32.7 g/dL (ref 30.0–36.0)
MCV: 86.8 fL (ref 78.0–100.0)
Platelets: 309 10*3/uL (ref 150–400)
RBC: 3.7 MIL/uL — ABNORMAL LOW (ref 3.87–5.11)
RDW: 13.3 % (ref 11.5–15.5)
WBC: 13.4 10*3/uL — ABNORMAL HIGH (ref 4.0–10.5)

## 2017-03-12 LAB — URINALYSIS, ROUTINE W REFLEX MICROSCOPIC
Bilirubin Urine: NEGATIVE
GLUCOSE, UA: NEGATIVE mg/dL
Hgb urine dipstick: NEGATIVE
KETONES UR: NEGATIVE mg/dL
Leukocytes, UA: NEGATIVE
Nitrite: NEGATIVE
PROTEIN: NEGATIVE mg/dL
Specific Gravity, Urine: 1.012 (ref 1.005–1.030)
pH: 6 (ref 5.0–8.0)

## 2017-03-12 LAB — URIC ACID: URIC ACID, SERUM: 8.2 mg/dL — AB (ref 2.3–6.6)

## 2017-03-12 MED ORDER — ACETAMINOPHEN 650 MG RE SUPP: 650.0000 mg | Freq: Four times a day (QID) | RECTAL | Status: DC | PRN

## 2017-03-12 MED ORDER — SIMVASTATIN 20 MG PO TABS
20.0000 mg | ORAL_TABLET | Freq: Every morning | ORAL | Status: DC
  Administered 2017-03-12 – 2017-03-13 (×2): 20 mg via ORAL
  Filled 2017-03-12 (×2): qty 1

## 2017-03-12 MED ORDER — LEVOTHYROXINE SODIUM 88 MCG PO TABS
88.0000 ug | ORAL_TABLET | ORAL | Status: DC
  Administered 2017-03-12 – 2017-03-13 (×2): 88 ug via ORAL
  Filled 2017-03-12 (×2): qty 1

## 2017-03-12 MED ORDER — SODIUM CHLORIDE 0.9 % IV SOLN
INTRAVENOUS | Status: DC
  Administered 2017-03-12 (×3): via INTRAVENOUS

## 2017-03-12 MED ORDER — METHYLPREDNISOLONE SODIUM SUCC 40 MG IJ SOLR
40.0000 mg | Freq: Two times a day (BID) | INTRAMUSCULAR | Status: DC
  Administered 2017-03-12 – 2017-03-13 (×2): 40 mg via INTRAVENOUS
  Filled 2017-03-12 (×2): qty 1

## 2017-03-12 MED ORDER — METHYLPREDNISOLONE SODIUM SUCC 40 MG IJ SOLR
20.0000 mg | Freq: Two times a day (BID) | INTRAMUSCULAR | Status: DC
  Administered 2017-03-12 (×2): 20 mg via INTRAVENOUS
  Filled 2017-03-12 (×2): qty 1

## 2017-03-12 MED ORDER — LEVOTHYROXINE SODIUM 88 MCG PO TABS: 88.0000 ug | ORAL_TABLET | Freq: Every day | ORAL | Status: DC

## 2017-03-12 MED ORDER — COLCHICINE 0.6 MG PO TABS
0.6000 mg | ORAL_TABLET | Freq: Two times a day (BID) | ORAL | Status: DC
  Administered 2017-03-12 – 2017-03-13 (×2): 0.6 mg via ORAL
  Filled 2017-03-12 (×2): qty 1

## 2017-03-12 MED ORDER — LEVOTHYROXINE SODIUM 100 MCG PO TABS: 100.0000 ug | ORAL_TABLET | ORAL | Status: DC

## 2017-03-12 MED ORDER — MORPHINE SULFATE (PF) 2 MG/ML IV SOLN
2.0000 mg | INTRAVENOUS | Status: DC | PRN
  Administered 2017-03-12: 2 mg via INTRAVENOUS
  Filled 2017-03-12: qty 1

## 2017-03-12 MED ORDER — LISINOPRIL 10 MG PO TABS
10.0000 mg | ORAL_TABLET | Freq: Every day | ORAL | Status: DC
  Administered 2017-03-12 – 2017-03-13 (×2): 10 mg via ORAL
  Filled 2017-03-12 (×2): qty 1

## 2017-03-12 MED ORDER — BISOPROLOL-HYDROCHLOROTHIAZIDE 10-6.25 MG PO TABS
1.0000 | ORAL_TABLET | Freq: Every day | ORAL | Status: DC
  Administered 2017-03-12 – 2017-03-13 (×2): 1 via ORAL
  Filled 2017-03-12 (×3): qty 1

## 2017-03-12 MED ORDER — ENOXAPARIN SODIUM 40 MG/0.4ML ~~LOC~~ SOLN
40.0000 mg | SUBCUTANEOUS | Status: DC
  Administered 2017-03-12 – 2017-03-13 (×2): 40 mg via SUBCUTANEOUS
  Filled 2017-03-12 (×2): qty 0.4

## 2017-03-12 MED ORDER — ACETAMINOPHEN 325 MG PO TABS: 650.0000 mg | ORAL_TABLET | Freq: Four times a day (QID) | ORAL | Status: DC | PRN

## 2017-03-12 NOTE — Progress Notes (Signed)
PROGRESS NOTE    Regina Holden  ZOX:[id number] DOB: 09-11-1934 DOA: 03/11/2017 PCP: System, Pcp Not In    Brief Narrative:  82 year old female who is admitted to the hospital with significant pain and swelling in her right foot as well as left great toe. Cannot have acute gout. Patient is unable to stand or ambulate due to pain from acute inflammation. Admitted for further therapy. Started on IV steroids with mild improvement.   Assessment & Plan:   Principal Problem:   Gout of both feet Active Problems:   Foot pain, bilateral   Inflammation of foot joint   Temporal arteritis (HCC)   Polyarticular gout   1. Acute polyarticular gout. Uric acid mildly elevated. She's been started on intravenous steroids with mild improvement. She does have a history of autoimmune disease with temporal arteritis in the past as well as possible rheumatoid arthritis. She is chronically on low-dose prednisone. Continue IV steroids for now. Start on colchicine. 2. Hypertension. Blood pressure remains uncontrolled. Continue on Ziac and started on lisinopril 3. Hypothyroidism. Continue Synthroid 4. Hyperlipidemia. Continue on statin   DVT prophylaxis: Lovenox Code Status: Full code Family Communication: No family present Disposition Plan: Discharge home once improved   Consultants:     Procedures:     Antimicrobials:      Subjective: Continues to have pain in her feet bilaterally and is unable to stand. Overall, she feels that pain is mildly better than yesterday.  Objective: Vitals:   03/12/17 0500 03/12/17 0524 03/12/17 1453 03/12/17 1546  BP:  (!) 163/44  (!) 174/54  Pulse:  60  (!) 51  Resp:  18  19  Temp:  97.8 F (36.6 C)  97.8 F (36.6 C)  TempSrc:  Oral  Oral  SpO2:  97% 94% 97%  Weight: 75.1 kg (165 lb 9.1 oz)     Height:        Intake/Output Summary (Last 24 hours) at 03/12/17 1752 Last data filed at 03/12/17 0548  Gross per 24 hour  Intake           868.33 ml    Output              450 ml  Net           418.33 ml   Filed Weights   03/11/17 1817 03/12/17 0031 03/12/17 0500  Weight: 75.8 kg (167 lb) 75.1 kg (165 lb 9.6 oz) 75.1 kg (165 lb 9.1 oz)    Examination:  General exam: Appears calm and comfortable  Respiratory system: Clear to auscultation. Respiratory effort normal. Cardiovascular system: S1 & S2 heard, RRR. No JVD, murmurs, rubs, gallops or clicks. No pedal edema. Gastrointestinal system: Abdomen is nondistended, soft and nontender. No organomegaly or masses felt. Normal bowel sounds heard. Central nervous system: Alert and oriented. No focal neurological deficits. Extremities: Symmetric 5 x 5 power., swelling of right foot Skin: erythema noted over right dorsal foot Psychiatry: Judgement and insight appear normal. Mood & affect appropriate.     Data Reviewed: I have personally reviewed following labs and imaging studies  CBC:  Recent Labs Lab 03/08/17 1108 03/11/17 1946 03/12/17 0726  WBC 17.1* 18.2* 13.4*  NEUTROABS 14.5* 16.2*  --   HGB 11.9* 12.2 10.5*  HCT 36.0 36.4 32.1*  MCV 86.3 86.3 86.8  PLT 254 342 309   Basic Metabolic Panel:  Recent Labs Lab 03/08/17 1108 03/11/17 1946  NA 134* 133*  K 3.8 4.8  CL 99* 99*  CO2  26 22  GLUCOSE 120* 118*  BUN 17 26*  CREATININE 0.89 0.88  CALCIUM 9.1 9.1   GFR: Estimated Creatinine Clearance: 49.5 mL/min (by C-G formula based on SCr of 0.88 mg/dL). Liver Function Tests:  Recent Labs Lab 03/11/17 1946  AST 21  ALT 15  ALKPHOS 76  BILITOT 0.4  PROT 7.7  ALBUMIN 3.6   No results for input(s): LIPASE, AMYLASE in the last 168 hours. No results for input(s): AMMONIA in the last 168 hours. Coagulation Profile: No results for input(s): INR, PROTIME in the last 168 hours. Cardiac Enzymes: No results for input(s): CKTOTAL, CKMB, CKMBINDEX, TROPONINI in the last 168 hours. BNP (last 3 results) No results for input(s): PROBNP in the last 8760 hours. HbA1C: No  results for input(s): HGBA1C in the last 72 hours. CBG: No results for input(s): GLUCAP in the last 168 hours. Lipid Profile: No results for input(s): CHOL, HDL, LDLCALC, TRIG, CHOLHDL, LDLDIRECT in the last 72 hours. Thyroid Function Tests: No results for input(s): TSH, T4TOTAL, FREET4, T3FREE, THYROIDAB in the last 72 hours. Anemia Panel: No results for input(s): VITAMINB12, FOLATE, FERRITIN, TIBC, IRON, RETICCTPCT in the last 72 hours. Sepsis Labs: No results for input(s): PROCALCITON, LATICACIDVEN in the last 168 hours.  Recent Results (from the past 240 hour(s))  Culture, blood (routine x 2)     Status: None (Preliminary result)   Collection Time: 03/11/17  8:47 PM  Result Value Ref Range Status   Specimen Description BLOOD LEFT ARM  Final   Special Requests   Final    BOTTLES DRAWN AEROBIC AND ANAEROBIC Blood Culture adequate volume   Culture NO GROWTH < 12 HOURS  Final   Report Status PENDING  Incomplete  Culture, blood (routine x 2)     Status: None (Preliminary result)   Collection Time: 03/11/17  9:01 PM  Result Value Ref Range Status   Specimen Description BLOOD RIGHT ARM  Final   Special Requests   Final    BOTTLES DRAWN AEROBIC AND ANAEROBIC Blood Culture adequate volume   Culture NO GROWTH < 12 HOURS  Final   Report Status PENDING  Incomplete         Radiology Studies: Dg Foot 2 Views Right  Result Date: 03/11/2017 CLINICAL DATA:  Right foot pain and swelling for 5 days. Cellulitis. EXAM: RIGHT FOOT - 2 VIEW COMPARISON:  Right foot radiographs 3 days prior 03/08/2017 FINDINGS: There is no evidence of fracture or dislocation. No bony destructive change. Soft tissues are unremarkable. Degenerative subchondral cystic change in the medial cuneiform. Mild osteoarthritis of the first metatarsal phalangeal joint. Plantar calcaneal spur and Achilles tendon enthesophyte. Increased soft tissue edema from prior exam. No soft tissue air. No radiopaque foreign body. IMPRESSION:  Increased soft tissue edema consistent with stated history of cellulitis. No acute osseous abnormality or radiographic findings of osteomyelitis. No soft tissue air. Electronically Signed   By: Rubye Oaks M.D.   On: 03/11/2017 22:30        Scheduled Meds: . bisoprolol-hydrochlorothiazide  1 tablet Oral Daily  . colchicine  0.6 mg Oral BID  . enoxaparin (LOVENOX) injection  40 mg Subcutaneous Q24H  . [START ON 03/18/2017] levothyroxine  100 mcg Oral Once per day on Tue  . levothyroxine  88 mcg Oral Once per day on Sun Mon Wed Thu Fri Sat  . lisinopril  10 mg Oral Daily  . [START ON 03/13/2017] methylPREDNISolone (SOLU-MEDROL) injection  40 mg Intravenous Q12H  . simvastatin  20  mg Oral q morning - 10a   Continuous Infusions: . sodium chloride 100 mL/hr at 03/12/17 1123     LOS: 0 days    Time spent: 25mins    Nicolena Schurman, MD [facility] Pager [phone]  If 7PM-7AM, please contact night-coverage [email or web address] Password [email or web address] 03/12/2017, 5:52 PM

## 2017-03-12 NOTE — Care Management Note (Signed)
Case Management Note  Patient Details  Name: Regina CoverMartha Fann MRN: [id number] Date of Birth: 11/07/1934  Subjective/Objective:                  Pt coming from home. Has gout bilaterally, unable to ambulate. [facility] [place]. With husband, very ind at baseline.   Action/Plan: Anticipate DC home with self care. CM will cont to follow for DC planning needs. Needs will depend on pt's functional ability at DC.   Expected Discharge Date:    03/14/2017              Expected Discharge Plan:  Home/Self Care  In-House Referral:  NA  Discharge planning Services  CM Consult  Status of Service:  In process, will continue to follow  Malcolm MetroChildress, Moishe Schellenberg Demske, RN 03/12/2017, 12:34 PM

## 2017-03-12 NOTE — Progress Notes (Signed)
RN needed urine sample on pt, adv pt no urine in canister from purewick. Pt stated she thought the catheter sucked up the urine. Rn adv pt that she has to actually void for the catheter to suck up the urine. Pt verbalized understanding and stated she was peeing. RN bladder scanned pt and had >578 urine in bladder. [facility] Will continue to monitor pt

## 2017-03-12 NOTE — Plan of Care (Signed)
Problem: Pain Managment: Goal: General experience of comfort will improve Outcome: Progressing Pt c/o 10/10 pain and states she can not walk with excruciating pain. Pt asks for depend when admitted to floor and states that it hurts so bad she can't even walk to BR. Given Oxycodone x1 so far this shift. Pt educated on pain mgmt as well as medications available. Will continue to monitor pt

## 2017-03-12 NOTE — Care Management Obs Status (Signed)
MEDICARE OBSERVATION STATUS NOTIFICATION   Patient Details  Name: Regina Holden MRN: [id number] Date of Birth: 10/11/1934   Medicare Observation Status Notification Given:  Yes    Malcolm MetroChildress, Aliza Moret Demske, RN 03/12/2017, 12:33 PM

## 2017-03-13 DIAGNOSIS — M1000 Idiopathic gout, unspecified site: Secondary | ICD-10-CM | POA: Diagnosis not present

## 2017-03-13 DIAGNOSIS — E785 Hyperlipidemia, unspecified: Secondary | ICD-10-CM | POA: Diagnosis not present

## 2017-03-13 DIAGNOSIS — M109 Gout, unspecified: Secondary | ICD-10-CM | POA: Diagnosis not present

## 2017-03-13 DIAGNOSIS — I10 Essential (primary) hypertension: Secondary | ICD-10-CM | POA: Diagnosis not present

## 2017-03-13 LAB — CBC
HCT: 32.3 % — ABNORMAL LOW (ref 36.0–46.0)
HEMOGLOBIN: 10.4 g/dL — AB (ref 12.0–15.0)
MCH: 28.2 pg (ref 26.0–34.0)
MCHC: 32.2 g/dL (ref 30.0–36.0)
MCV: 87.5 fL (ref 78.0–100.0)
Platelets: 337 10*3/uL (ref 150–400)
RBC: 3.69 MIL/uL — AB (ref 3.87–5.11)
RDW: 13.4 % (ref 11.5–15.5)
WBC: 18.2 10*3/uL — ABNORMAL HIGH (ref 4.0–10.5)

## 2017-03-13 LAB — BASIC METABOLIC PANEL
Anion gap: 9 (ref 5–15)
BUN: 41 mg/dL — ABNORMAL HIGH (ref 6–20)
CHLORIDE: 105 mmol/L (ref 101–111)
CO2: 24 mmol/L (ref 22–32)
CREATININE: 1.1 mg/dL — AB (ref 0.44–1.00)
Calcium: 8.3 mg/dL — ABNORMAL LOW (ref 8.9–10.3)
GFR calc Af Amer: 53 mL/min — ABNORMAL LOW (ref >60)
GFR calc non Af Amer: 46 mL/min — ABNORMAL LOW (ref >60)
Glucose, Bld: 139 mg/dL — ABNORMAL HIGH (ref 65–99)
POTASSIUM: 4.7 mmol/L (ref 3.5–5.1)
SODIUM: 138 mmol/L (ref 135–145)

## 2017-03-13 MED ORDER — COLCHICINE 0.6 MG PO TABS: 0.6000 mg | ORAL_TABLET | Freq: Two times a day (BID) | ORAL | 0 refills | Status: AC

## 2017-03-13 MED ORDER — OXYCODONE-ACETAMINOPHEN 7.5-325 MG PO TABS: 1.0000 | ORAL_TABLET | Freq: Four times a day (QID) | ORAL | 0 refills | Status: AC | PRN

## 2017-03-13 MED ORDER — PREDNISONE 10 MG PO TABS: ORAL_TABLET | ORAL | 0 refills | Status: AC

## 2017-03-13 MED ORDER — LISINOPRIL 10 MG PO TABS
10.0000 mg | ORAL_TABLET | Freq: Every day | ORAL | 0 refills | Status: AC
Start: 2017-03-14 — End: ?

## 2017-03-13 NOTE — Discharge Summary (Signed)
Physician Discharge Summary  Regina Holden ZOX:[id number] DOB: [birth year] DOA: 03/11/2017  PCP: System, Pcp Not In  Admit date: 03/11/2017 Discharge date: 03/13/2017  Admitted From: home Disposition:  home  Recommendations for Outpatient Follow-up:  1. Follow up with PCP in 1-2 weeks 2. Please obtain BMP/CBC in one week  Discharge Condition: stable CODE STATUS: full code Diet recommendation: Heart Healthy   Brief/Interim Summary: 82 year old female who is admitted to the hospital with significant pain and swelling in her right foot as well as left great toe. Cannot have acute gout. Patient is unable to stand or ambulate due to pain from acute inflammation. Admitted for further therapy. Started on IV steroids with mild improvement  Discharge Diagnoses:  Principal Problem:   Gout of both feet Active Problems:   Foot pain, bilateral   Inflammation of foot joint   Temporal arteritis (HCC)   Polyarticular gout  1. Acute polyarticular gout. Uric acid mildly elevated. She was treated with intravenous steroids and colchicine and had significant improvement. She does have a history of autoimmune disease with temporal arteritis in the past as well as possible rheumatoid arthritis. She is chronically on low-dose prednisone. She is now able to ambulate. Will transition to prednisone taper and decrease dose back to maintenance dose. 2. Hypertension. Blood pressure remains mildly elevated. Continue on ziac. Started on lisinopril. Further titration per pcp. 3. Hypothyroidism. Continue Synthroid 4. Hyperlipidemia. Continue on statin  Discharge Instructions  Discharge Instructions    Diet - low sodium heart healthy    Complete by:  As directed    Increase activity slowly    Complete by:  As directed      Allergies as of 03/13/2017      Reactions   Codeine    Macrobid [nitrofurantoin Macrocrystal]    Amlodipine Swelling, Palpitations   Penicillins Swelling, Rash   Has patient had a PCN  reaction causing immediate rash, facial/tongue/throat swelling, SOB or lightheadedness with hypotension: Yes Has patient had a PCN reaction causing severe rash involving mucus membranes or skin necrosis: No Has patient had a PCN reaction that required hospitalization: No Has patient had a PCN reaction occurring within the last 10 years: Non If all of the above answers are "NO", then may proceed with Cephalosporin use.      Medication List    TAKE these medications   B-COMPLEX-C PO Take 1 capsule by mouth daily.   BIOTIN PO Take 1 capsule by mouth daily.   bisoprolol-hydrochlorothiazide 10-6.25 MG tablet Commonly known as:  ZIAC Take 1 tablet by mouth daily.   colchicine 0.6 MG tablet Take 1 tablet (0.6 mg total) by mouth 2 (two) times daily. For 5 days then 1 tab po bid prn gout   diclofenac sodium 1 % Gel Commonly known as:  VOLTAREN Apply 2-4 g topically 4 (four) times daily as needed (for pain).   ECHINACEA EXTRACT PO Take 1 capsule by mouth daily.   levothyroxine 88 MCG tablet Commonly known as:  SYNTHROID, LEVOTHROID Take 88 mcg by mouth daily before breakfast. Takes only for 6 days a week. Takes on Tuesdays only   levothyroxine 100 MCG tablet Commonly known as:  SYNTHROID, LEVOTHROID Take 100 mcg by mouth every Tuesday.   lisinopril 10 MG tablet Commonly known as:  PRINIVIL,ZESTRIL Take 1 tablet (10 mg total) by mouth daily.   oxyCODONE-acetaminophen 7.5-325 MG tablet Commonly known as:  PERCOCET Take 1 tablet by mouth every 6 (six) hours as needed for moderate pain.  predniSONE 10 MG tablet Commonly known as:  DELTASONE Take 40mg  po daily for 2 days then 30mg  daily for 2 days then 20mg  daily for 2 days then 10mg  daily for 2 days 5mg  daily What changed:  medication strength  how much to take  how to take this  when to take this  additional instructions   simvastatin 20 MG tablet Commonly known as:  ZOCOR Take 20 mg by mouth every  morning.            Discharge Care Instructions        Start     Ordered   03/14/17 0000  lisinopril (PRINIVIL,ZESTRIL) 10 MG tablet  Daily     03/13/17 1256   03/13/17 0000  colchicine 0.6 MG tablet  2 times daily     03/13/17 1256   03/13/17 0000  predniSONE (DELTASONE) 10 MG tablet     03/13/17 1256   03/13/17 0000  oxyCODONE-acetaminophen (PERCOCET) 7.5-325 MG tablet  Every 6 hours PRN     03/13/17 1256   03/13/17 0000  Increase activity slowly     03/13/17 1256   03/13/17 0000  Diet - low sodium heart healthy     03/13/17 1256      Allergies  Allergen Reactions  . Codeine   . Macrobid [Nitrofurantoin Macrocrystal]   . Amlodipine Swelling and Palpitations  . Penicillins Swelling and Rash    Has patient had a PCN reaction causing immediate rash, facial/tongue/throat swelling, SOB or lightheadedness with hypotension: Yes Has patient had a PCN reaction causing severe rash involving mucus membranes or skin necrosis: No Has patient had a PCN reaction that required hospitalization: No Has patient had a PCN reaction occurring within the last 10 years: Non If all of the above answers are "NO", then may proceed with Cephalosporin use.     Consultations:     Procedures/Studies: Dg Ankle Complete Right  Result Date: 03/08/2017 CLINICAL DATA:  Right foot and ankle pain. Swelling over the dorsum of the foot. Initial encounter. EXAM: RIGHT ANKLE - COMPLETE 3+ VIEW COMPARISON:  Right foot radiographs of the same day. FINDINGS: Soft tissue swelling is present of the dorsum of the hindfoot and anterior to the ankle. There is no acute osseous abnormality. Mild osteopenia is noted. Vascular calcifications are present. Calcaneal spurs are noted. IMPRESSION: 1. Soft tissue swelling over the dorsum of the hindfoot and anterior to the ankle without underlying fracture. This may reflect soft tissue injury, edema, or cellulitis. 2. No acute or focal osseous abnormality. 3. Microvascular  calcifications suggesting underlying diabetes. Electronically Signed   By: Marin Robertshristopher  Mattern M.D.   On: 03/08/2017 11:53   Dg Foot 2 Views Right  Result Date: 03/11/2017 CLINICAL DATA:  Right foot pain and swelling for 5 days. Cellulitis. EXAM: RIGHT FOOT - 2 VIEW COMPARISON:  Right foot radiographs 3 days prior 03/08/2017 FINDINGS: There is no evidence of fracture or dislocation. No bony destructive change. Soft tissues are unremarkable. Degenerative subchondral cystic change in the medial cuneiform. Mild osteoarthritis of the first metatarsal phalangeal joint. Plantar calcaneal spur and Achilles tendon enthesophyte. Increased soft tissue edema from prior exam. No soft tissue air. No radiopaque foreign body. IMPRESSION: Increased soft tissue edema consistent with stated history of cellulitis. No acute osseous abnormality or radiographic findings of osteomyelitis. No soft tissue air. Electronically Signed   By: Rubye OaksMelanie  Ehinger M.D.   On: 03/11/2017 22:30   Dg Foot Complete Right  Result Date: 03/08/2017 CLINICAL DATA:  pain that radiates up to her ankle 3 days. Pt states about 3 days ago developed a cramping pain in her right foot. Over the past few days has developed swelling to the dorsum of her right foot. She has been unable to bear weight on that foot due to pain. Pt denies any injury EXAM: RIGHT FOOT COMPLETE - 3+ VIEW COMPARISON:  None. FINDINGS: There is no evidence of fracture or dislocation. Mild osteopenia. Calcaneal spurs. There is no evidence of arthropathy or other focal bone abnormality. Calcifications in the distal posterior tibial artery. Soft tissues are unremarkable. IMPRESSION: 1. Negative for fracture or other acute bone finding. 2. Osteopenia and calcaneal spurs Electronically Signed   By: Corlis Leak M.D.   On: 03/08/2017 11:53       Subjective: Feeling better. Able to ambulate to bathroom this morning. Pain in feet improving  Discharge Exam: Vitals:   03/12/17 2005  03/13/17 0516  BP:  (!) 168/54  Pulse:  (!) 59  Resp:  18  Temp:  98.2 F (36.8 C)  SpO2: 94% 98%   Vitals:   03/12/17 1453 03/12/17 1546 03/12/17 2005 03/13/17 0516  BP:  (!) 174/54  (!) 168/54  Pulse:  (!) 51  (!) 59  Resp:  19  18  Temp:  97.8 F (36.6 C)  98.2 F (36.8 C)  TempSrc:  Oral  Oral  SpO2: 94% 97% 94% 98%  Weight:    77.6 kg (171 lb 1.2 oz)  Height:        General: Pt is alert, awake, not in acute distress Cardiovascular: RRR, S1/S2 +, no rubs, no gallops Respiratory: CTA bilaterally, no wheezing, no rhonchi Abdominal: Soft, NT, ND, bowel sounds + Extremities: some edema in feet bilaterally but overall erythema and tenderness improving    The results of significant diagnostics from this hospitalization (including imaging, microbiology, ancillary and laboratory) are listed below for reference.     Microbiology: Recent Results (from the past 240 hour(s))  Culture, blood (routine x 2)     Status: None (Preliminary result)   Collection Time: 03/11/17  8:47 PM  Result Value Ref Range Status   Specimen Description BLOOD LEFT ARM  Final   Special Requests   Final    BOTTLES DRAWN AEROBIC AND ANAEROBIC Blood Culture adequate volume   Culture NO GROWTH 2 DAYS  Final   Report Status PENDING  Incomplete  Culture, blood (routine x 2)     Status: None (Preliminary result)   Collection Time: 03/11/17  9:01 PM  Result Value Ref Range Status   Specimen Description BLOOD RIGHT ARM  Final   Special Requests   Final    BOTTLES DRAWN AEROBIC AND ANAEROBIC Blood Culture adequate volume   Culture NO GROWTH 2 DAYS  Final   Report Status PENDING  Incomplete     Labs: BNP (last 3 results) No results for input(s): BNP in the last 8760 hours. Basic Metabolic Panel:  Recent Labs Lab 03/08/17 1108 03/11/17 1946 03/13/17 0450  NA 134* 133* 138  K 3.8 4.8 4.7  CL 99* 99* 105  CO2 GLUCOSE 120* 118* 139*  BUN 17 26* 41*  CREATININE 0.89 0.88 1.10*   CALCIUM 9.1 9.1 8.3*   Liver Function Tests:  Recent Labs Lab 03/11/17 1946  AST 21  ALT 15  ALKPHOS 76  BILITOT 0.4  PROT 7.7  ALBUMIN 3.6   No results for input(s): LIPASE, AMYLASE in the last 168 hours.  No results for input(s): AMMONIA in the last 168 hours. CBC:  Recent Labs Lab 03/08/17 1108 03/11/17 1946 03/12/17 0726 03/13/17 0450  WBC 17.1* 18.2* 13.4* 18.2*  NEUTROABS 14.5* 16.2*  --   --   HGB 11.9* 12.2 10.5* 10.4*  HCT 36.0 36.4 32.1* 32.3*  MCV 86.3 86.3 86.8 87.5  PLT 254 342 309 337   Cardiac Enzymes: No results for input(s): CKTOTAL, CKMB, CKMBINDEX, TROPONINI in the last 168 hours. BNP: Invalid input(s): POCBNP CBG: No results for input(s): GLUCAP in the last 168 hours. D-Dimer No results for input(s): DDIMER in the last 72 hours. Hgb A1c No results for input(s): HGBA1C in the last 72 hours. Lipid Profile No results for input(s): CHOL, HDL, LDLCALC, TRIG, CHOLHDL, LDLDIRECT in the last 72 hours. Thyroid function studies No results for input(s): TSH, T4TOTAL, T3FREE, THYROIDAB in the last 72 hours.  Invalid input(s): FREET3 Anemia work up No results for input(s): VITAMINB12, FOLATE, FERRITIN, TIBC, IRON, RETICCTPCT in the last 72 hours. Urinalysis    Component Value Date/Time   COLORURINE YELLOW 03/12/2017 0548   APPEARANCEUR CLEAR 03/12/2017 0548   LABSPEC 1.012 03/12/2017 0548   PHURINE 6.0 03/12/2017 0548   GLUCOSEU NEGATIVE 03/12/2017 0548   HGBUR NEGATIVE 03/12/2017 0548   BILIRUBINUR NEGATIVE 03/12/2017 0548   KETONESUR NEGATIVE 03/12/2017 0548   PROTEINUR NEGATIVE 03/12/2017 0548   NITRITE NEGATIVE 03/12/2017 0548   LEUKOCYTESUR NEGATIVE 03/12/2017 0548   Sepsis Labs Invalid input(s): PROCALCITONIN,  WBC,  LACTICIDVEN Microbiology Recent Results (from the past 240 hour(s))  Culture, blood (routine x 2)     Status: None (Preliminary result)   Collection Time: 03/11/17  8:47 PM  Result Value Ref Range Status   Specimen  Description BLOOD LEFT ARM  Final   Special Requests   Final    BOTTLES DRAWN AEROBIC AND ANAEROBIC Blood Culture adequate volume   Culture NO GROWTH 2 DAYS  Final   Report Status PENDING  Incomplete  Culture, blood (routine x 2)     Status: None (Preliminary result)   Collection Time: 03/11/17  9:01 PM  Result Value Ref Range Status   Specimen Description BLOOD RIGHT ARM  Final   Special Requests   Final    BOTTLES DRAWN AEROBIC AND ANAEROBIC Blood Culture adequate volume   Culture NO GROWTH 2 DAYS  Final   Report Status PENDING  Incomplete     Time coordinating discharge: Over 30 minutes  SIGNED:   Erick Blinks, MD  [facility] 03/13/2017, 12:57 PM Pager   If 7PM-7AM, please contact night-coverage [email or web address] Password [email or web address]

## 2017-03-16 LAB — CULTURE, BLOOD (ROUTINE X 2)
CULTURE: NO GROWTH
Culture: NO GROWTH
SPECIAL REQUESTS: ADEQUATE
Special Requests: ADEQUATE

## 2018-11-16 IMAGING — DX DG FOOT COMPLETE 3+V*R*
3 series · 3 of 3 positions shown · non-contrast
Comparison: None.

CLINICAL DATA: pain that radiates up to her ankle ?3 days. Pt
states about 3 days ago developed a cramping pain in her right foot.
Over the past few days has developed swelling to the dorsum of her
right foot. She has been unable to bear weight on that foot due to
pain. Pt denies any injury

EXAM:
RIGHT FOOT COMPLETE - 3+ VIEW

[foot ap]
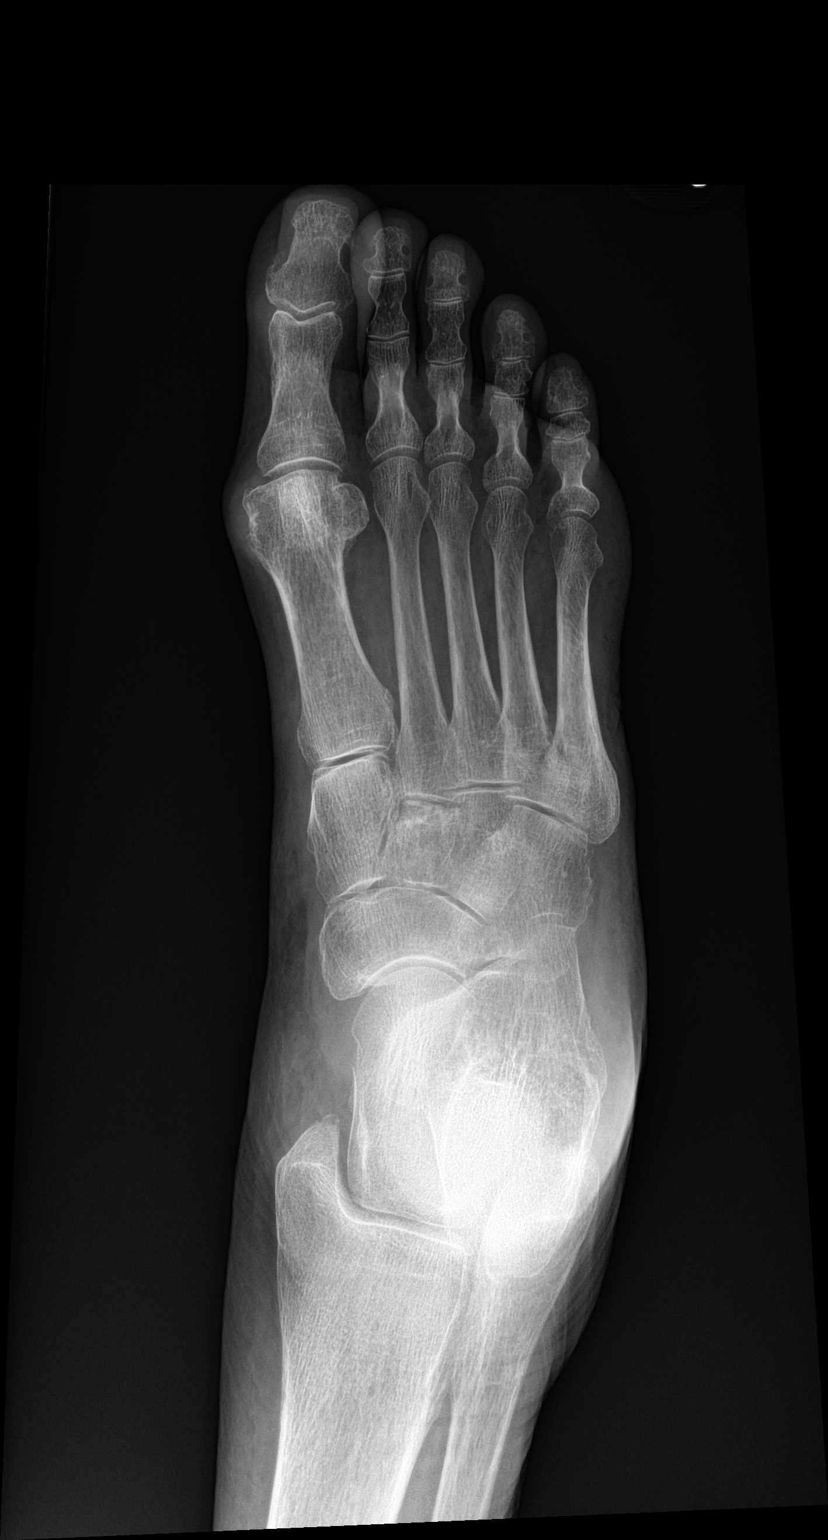

[foot lat]
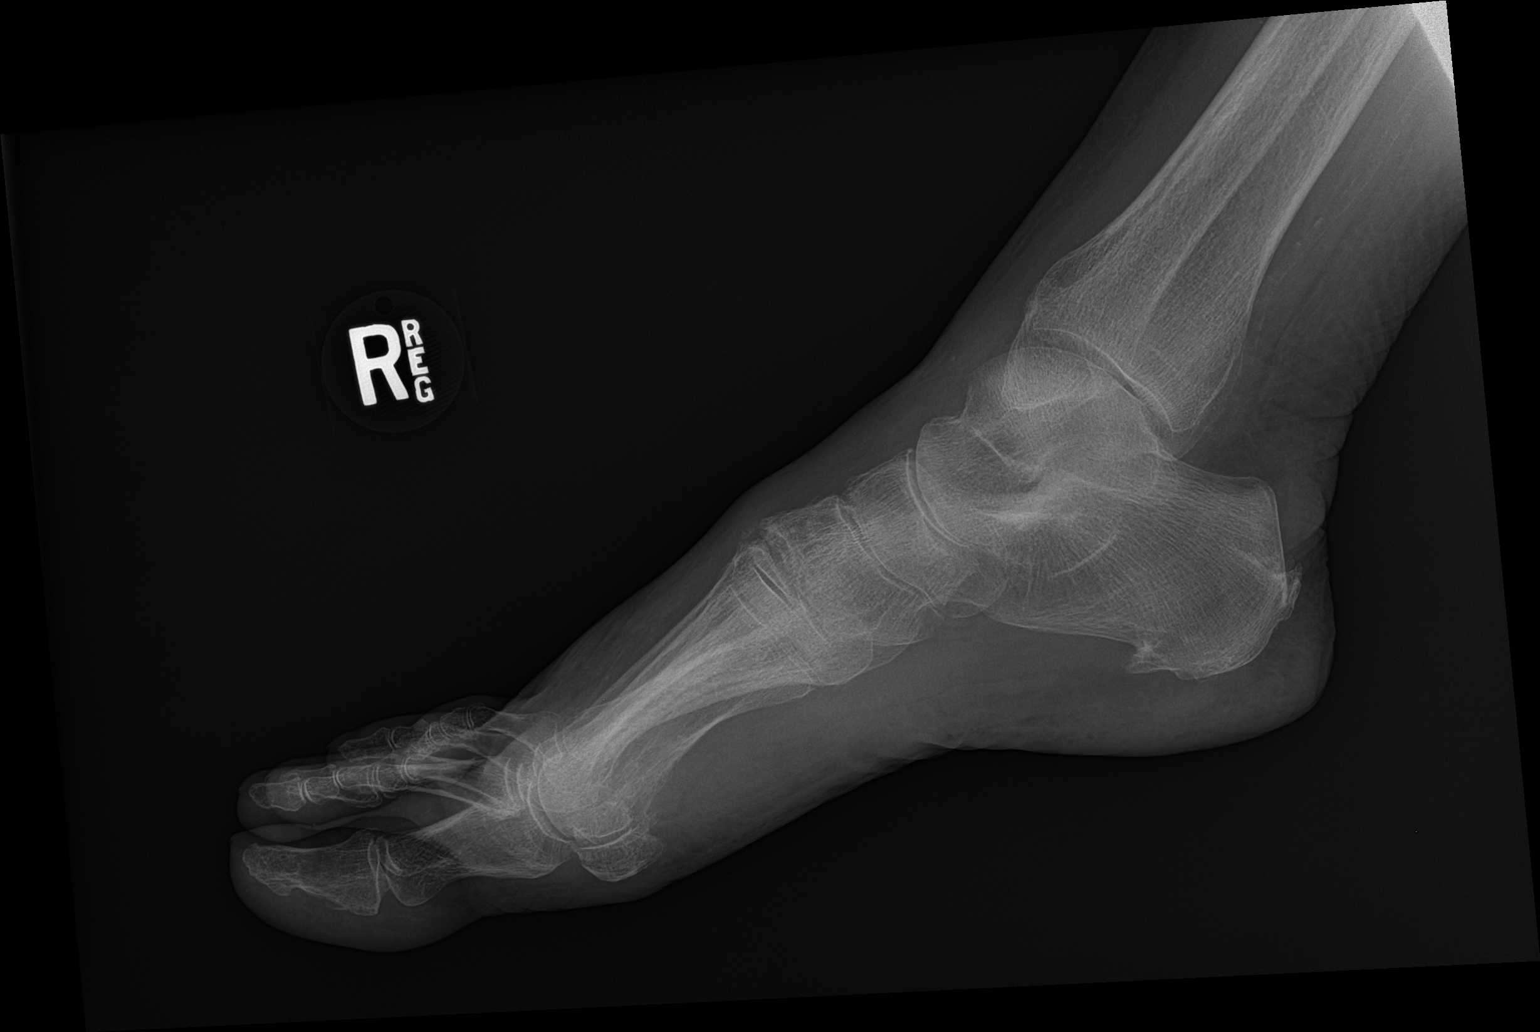

[foot obl]
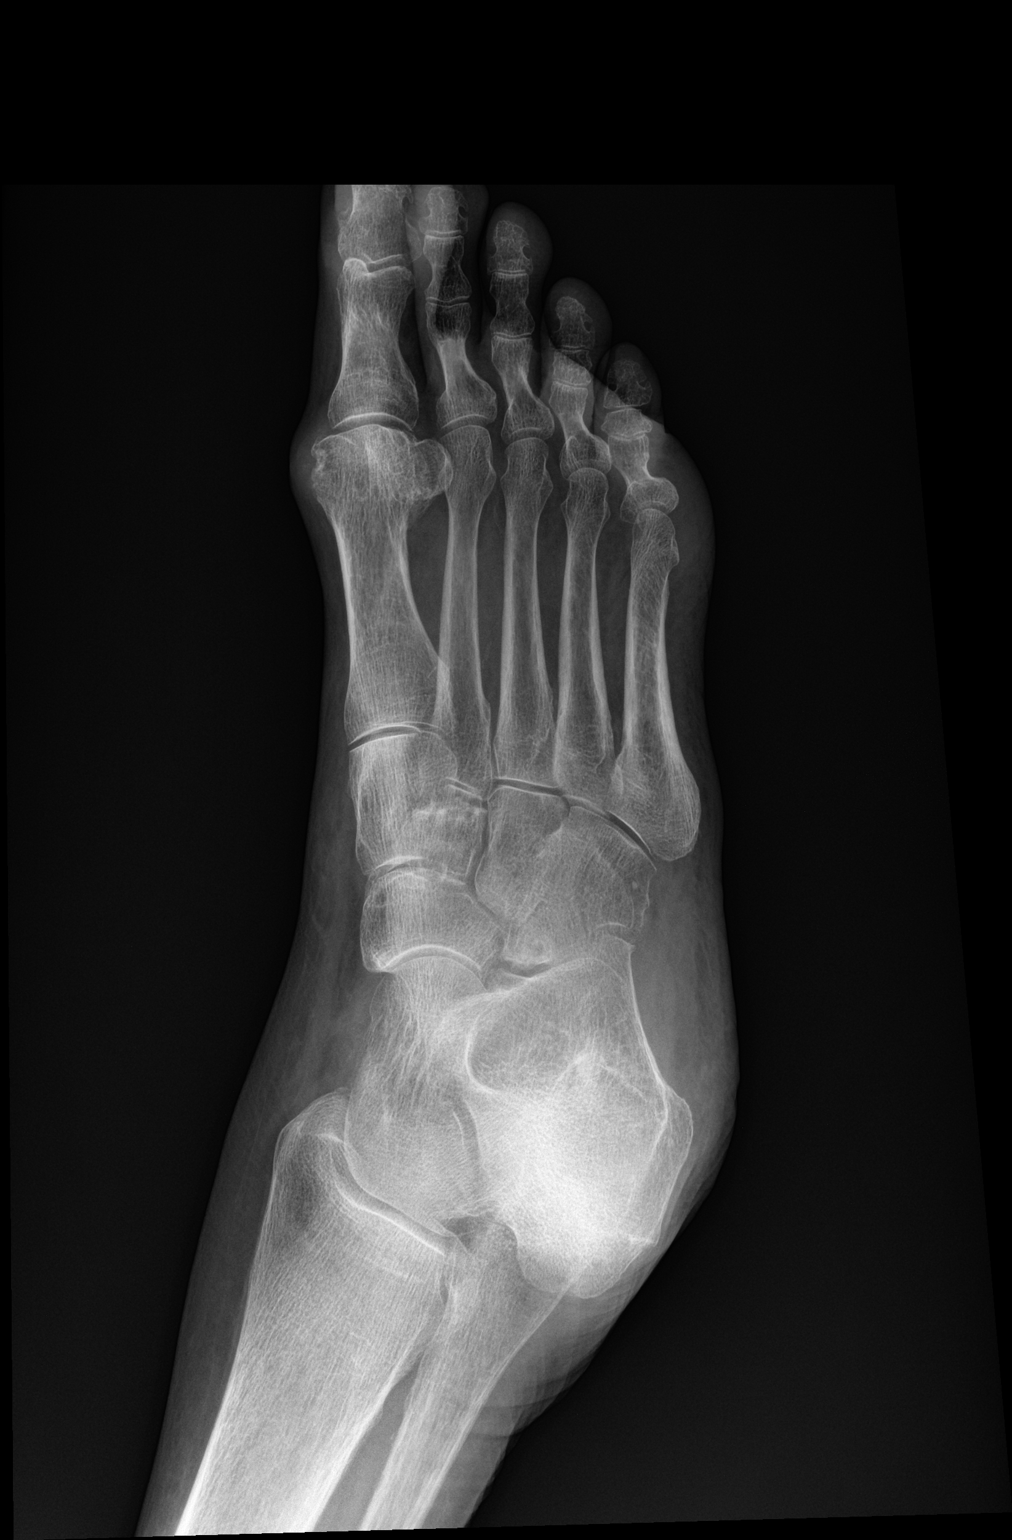

[3 of 3 positions shown; findings below may reference images not displayed]

FINDINGS: There is no evidence of fracture or dislocation. Mild osteopenia.
Calcaneal spurs. There is no evidence of arthropathy or other focal
bone abnormality. Calcifications in the distal posterior tibial
artery. Soft tissues are unremarkable.
IMPRESSION: 1. Negative for fracture or other acute bone finding.
2. Osteopenia and calcaneal spurs

## 2018-11-16 IMAGING — DX DG ANKLE COMPLETE 3+V*R*
3 series · 3 of 3 positions shown · non-contrast
Comparison: Right foot radiographs of the same day.

CLINICAL DATA: Right foot and ankle pain. Swelling over the dorsum
of the foot. Initial encounter.

EXAM:
RIGHT ANKLE - COMPLETE 3+ VIEW

[ankle ap]
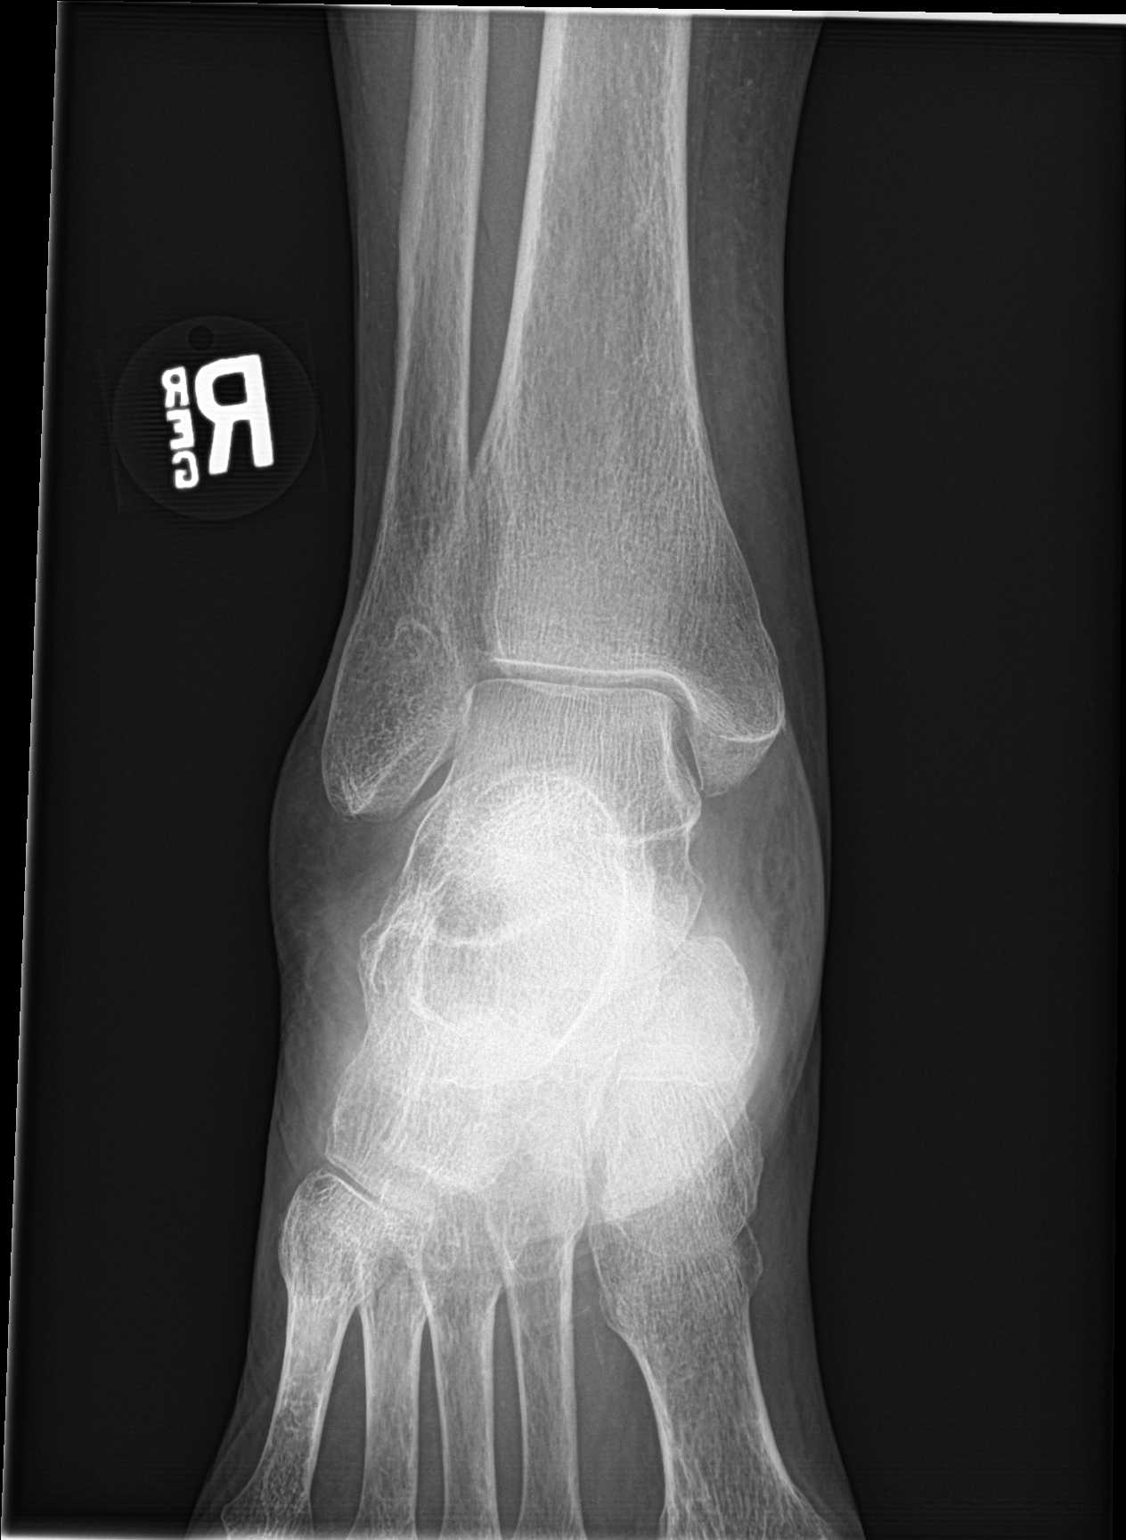

[ankle obl]
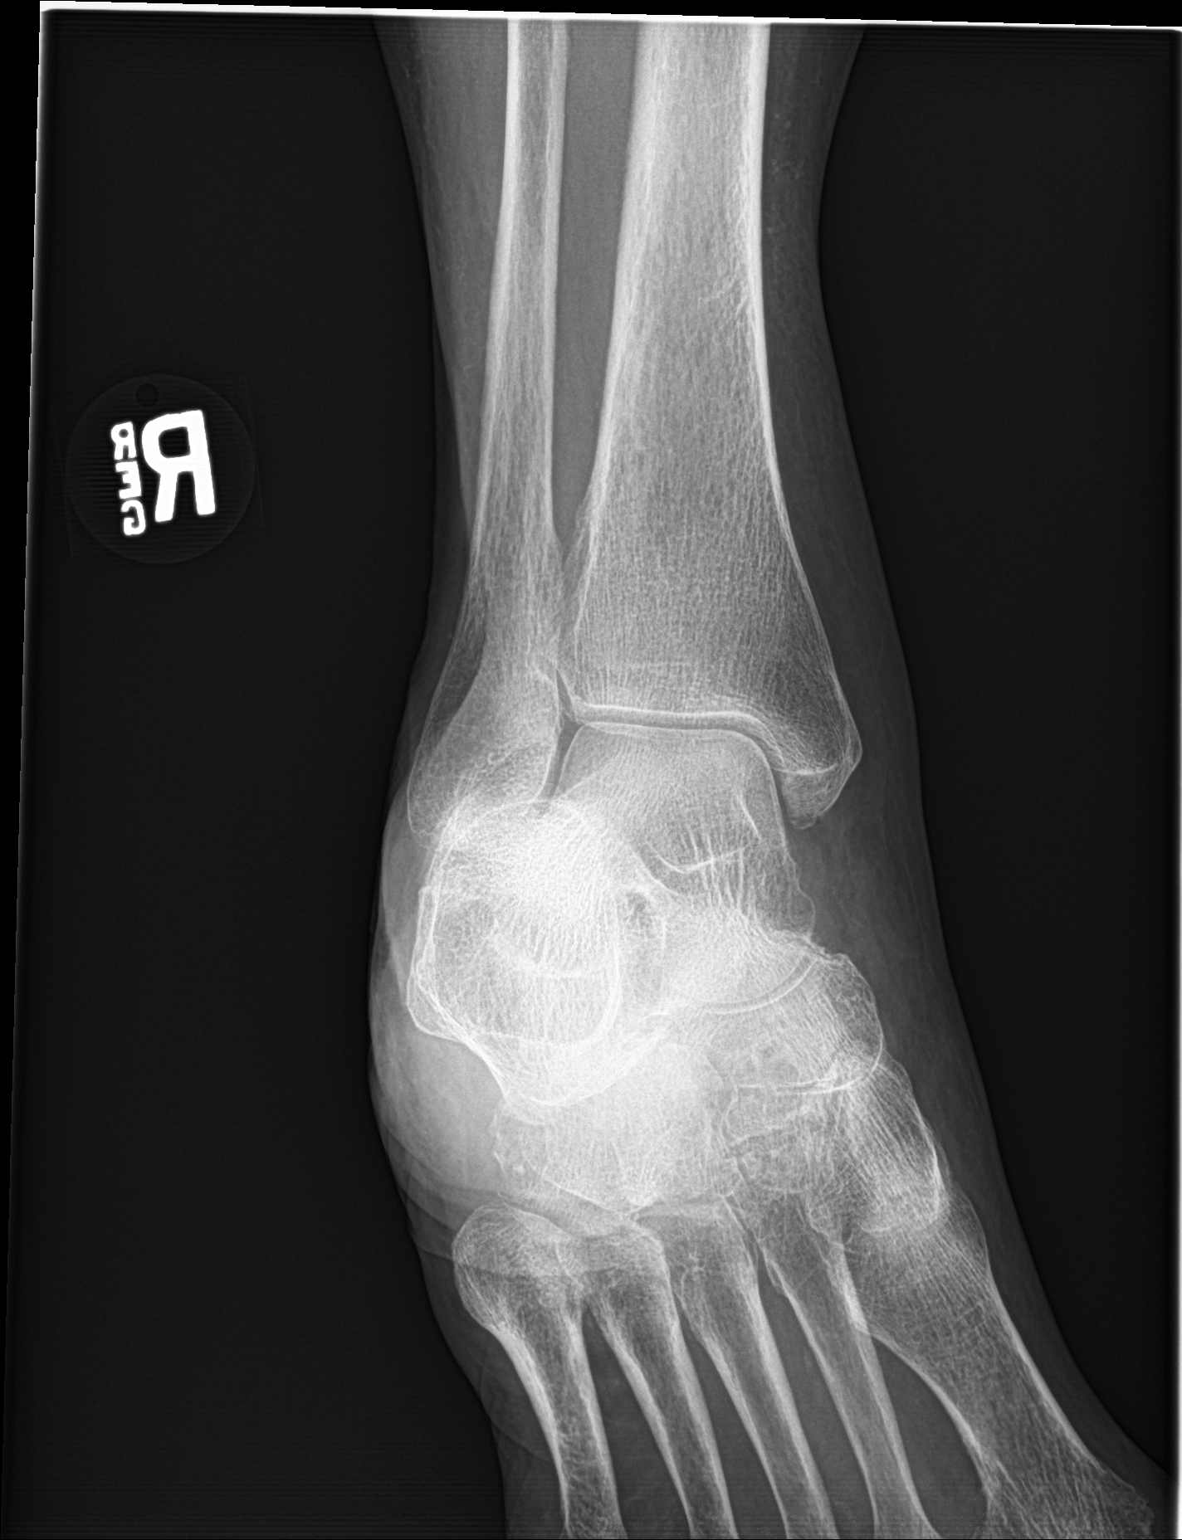

[ankle lat]
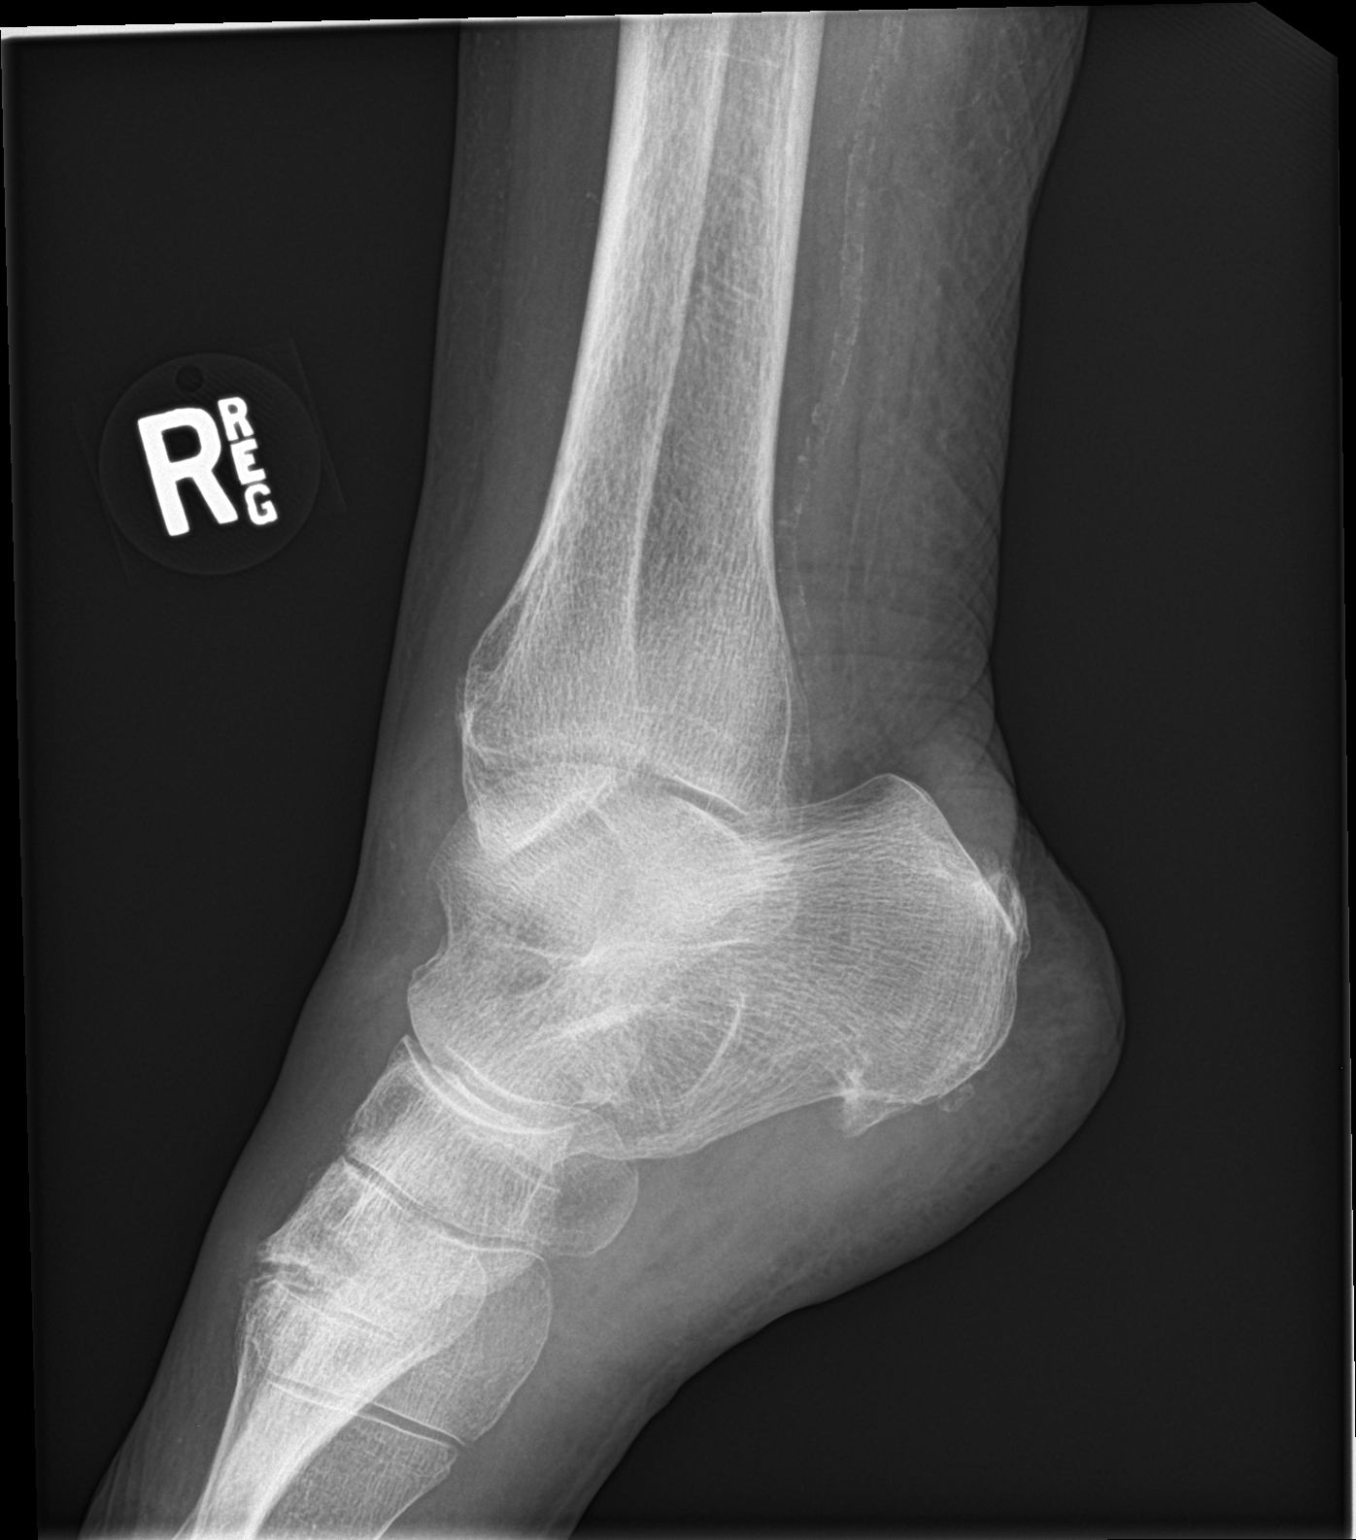

[3 of 3 positions shown; findings below may reference images not displayed]

FINDINGS: Soft tissue swelling is present of the dorsum of the hindfoot and
anterior to the ankle. There is no acute osseous abnormality. Mild
osteopenia is noted. Vascular calcifications are present. Calcaneal
spurs are noted.
IMPRESSION: 1. Soft tissue swelling over the dorsum of the hindfoot and anterior
to the ankle without underlying fracture. This may reflect soft
tissue injury, edema, or cellulitis.
2. No acute or focal osseous abnormality.
3. Microvascular calcifications suggesting underlying diabetes.

## 2019-08-13 ENCOUNTER — Telehealth: Payer: Self-pay

## 2019-08-13 NOTE — Telephone Encounter (Signed)
error 

## 2020-07-07 MED FILL — DIFICID 200 MG TABLET: 200 | 10 days supply | Qty: 20 | Fill #0

## 2024-02-06 DEATH — deceased
